# Patient Record
Sex: Female | Born: 1980 | Race: Asian | Hispanic: No | Marital: Married | Smoking: Never smoker
Health system: Southern US, Community
[De-identification: ages and names within clinical notes are randomized; demographics above are authoritative.]

---

## 2002-02-24 ENCOUNTER — Encounter: Admission: RE | Admit: 2002-02-24 | Discharge: 2002-02-24 | Payer: Self-pay | Admitting: Urology

## 2002-02-24 ENCOUNTER — Encounter: Payer: Self-pay | Admitting: Urology

## 2004-06-20 ENCOUNTER — Other Ambulatory Visit: Admission: RE | Admit: 2004-06-20 | Discharge: 2004-06-20 | Payer: Self-pay | Admitting: Obstetrics and Gynecology

## 2004-07-18 ENCOUNTER — Ambulatory Visit (HOSPITAL_COMMUNITY): Admission: RE | Admit: 2004-07-18 | Discharge: 2004-07-18 | Payer: Self-pay | Admitting: Obstetrics and Gynecology

## 2004-10-18 ENCOUNTER — Inpatient Hospital Stay (HOSPITAL_COMMUNITY): Admission: AD | Admit: 2004-10-18 | Discharge: 2004-10-24 | Payer: Self-pay | Admitting: *Deleted

## 2004-10-20 ENCOUNTER — Ambulatory Visit: Payer: Self-pay | Admitting: *Deleted

## 2004-10-22 ENCOUNTER — Encounter (INDEPENDENT_AMBULATORY_CARE_PROVIDER_SITE_OTHER): Payer: Self-pay | Admitting: *Deleted

## 2005-08-13 ENCOUNTER — Other Ambulatory Visit: Admission: RE | Admit: 2005-08-13 | Discharge: 2005-08-13 | Payer: Self-pay | Admitting: Obstetrics and Gynecology

## 2006-07-25 ENCOUNTER — Encounter: Admission: RE | Admit: 2006-07-25 | Discharge: 2006-07-25 | Payer: Self-pay | Admitting: Internal Medicine

## 2006-08-05 ENCOUNTER — Ambulatory Visit (HOSPITAL_COMMUNITY): Admission: RE | Admit: 2006-08-05 | Discharge: 2006-08-05 | Payer: Self-pay | Admitting: Internal Medicine

## 2007-05-06 ENCOUNTER — Emergency Department (HOSPITAL_COMMUNITY): Admission: EM | Admit: 2007-05-06 | Discharge: 2007-05-06 | Payer: Self-pay | Admitting: Emergency Medicine

## 2009-04-24 ENCOUNTER — Encounter: Payer: Self-pay | Admitting: Obstetrics and Gynecology

## 2009-04-24 ENCOUNTER — Inpatient Hospital Stay (HOSPITAL_COMMUNITY): Admission: AD | Admit: 2009-04-24 | Discharge: 2009-05-17 | Payer: Self-pay | Admitting: Obstetrics and Gynecology

## 2009-05-01 ENCOUNTER — Encounter: Payer: Self-pay | Admitting: Obstetrics and Gynecology

## 2009-05-04 ENCOUNTER — Encounter: Payer: Self-pay | Admitting: Obstetrics and Gynecology

## 2009-05-08 ENCOUNTER — Encounter: Payer: Self-pay | Admitting: Obstetrics and Gynecology

## 2009-05-12 ENCOUNTER — Encounter: Payer: Self-pay | Admitting: Obstetrics and Gynecology

## 2009-05-15 ENCOUNTER — Encounter: Payer: Self-pay | Admitting: Obstetrics and Gynecology

## 2009-06-29 ENCOUNTER — Inpatient Hospital Stay (HOSPITAL_COMMUNITY): Admission: AD | Admit: 2009-06-29 | Discharge: 2009-07-03 | Payer: Self-pay | Admitting: Obstetrics and Gynecology

## 2009-07-16 ENCOUNTER — Inpatient Hospital Stay (HOSPITAL_COMMUNITY): Admission: AD | Admit: 2009-07-16 | Discharge: 2009-07-19 | Payer: Self-pay | Admitting: Obstetrics and Gynecology

## 2009-07-17 ENCOUNTER — Encounter (INDEPENDENT_AMBULATORY_CARE_PROVIDER_SITE_OTHER): Payer: Self-pay | Admitting: Obstetrics and Gynecology

## 2010-04-15 ENCOUNTER — Encounter: Payer: Self-pay | Admitting: Obstetrics and Gynecology

## 2010-06-10 LAB — CBC
HCT: 36.8 % (ref 36.0–46.0)
Hemoglobin: 11.9 g/dL — ABNORMAL LOW (ref 12.0–15.0)
MCHC: 32.4 g/dL (ref 30.0–36.0)
RBC: 4.54 MIL/uL (ref 3.87–5.11)
RDW: 14.3 % (ref 11.5–15.5)

## 2010-06-10 LAB — COMPREHENSIVE METABOLIC PANEL
ALT: 14 U/L (ref 0–35)
Alkaline Phosphatase: 52 U/L (ref 39–117)
BUN: 6 mg/dL (ref 6–23)
CO2: 24 mEq/L (ref 19–32)
Calcium: 9.1 mg/dL (ref 8.4–10.5)
GFR calc non Af Amer: >60 mL/min (ref >60)
Glucose, Bld: 71 mg/dL (ref 70–99)
Potassium: 4.1 mEq/L (ref 3.5–5.1)
Sodium: 137 mEq/L (ref 135–145)
Total Protein: 6.8 g/dL (ref 6.0–8.3)

## 2010-06-10 LAB — URINE CULTURE: Culture: NO GROWTH

## 2010-06-10 LAB — RPR: RPR Ser Ql: NONREACTIVE

## 2010-06-10 LAB — LACTATE DEHYDROGENASE: LDH: 196 U/L (ref 94–250)

## 2010-06-12 LAB — COMPREHENSIVE METABOLIC PANEL
ALT: 13 U/L (ref 0–35)
ALT: 14 U/L (ref 0–35)
AST: 19 U/L (ref 0–37)
AST: 23 U/L (ref 0–37)
Albumin: 2.6 g/dL — ABNORMAL LOW (ref 3.5–5.2)
Alkaline Phosphatase: 93 U/L (ref 39–117)
CO2: 27 mEq/L (ref 19–32)
Calcium: 6.8 mg/dL — ABNORMAL LOW (ref 8.4–10.5)
Calcium: 9 mg/dL (ref 8.4–10.5)
Creatinine, Ser: 0.47 mg/dL (ref 0.4–1.2)
GFR calc Af Amer: >60 mL/min (ref >60)
GFR calc Af Amer: >60 mL/min (ref >60)
GFR calc non Af Amer: >60 mL/min (ref >60)
GFR calc non Af Amer: >60 mL/min (ref >60)
Potassium: 4.4 mEq/L (ref 3.5–5.1)
Sodium: 131 mEq/L — ABNORMAL LOW (ref 135–145)
Sodium: 134 mEq/L — ABNORMAL LOW (ref 135–145)
Total Protein: 6.3 g/dL (ref 6.0–8.3)

## 2010-06-12 LAB — CBC
MCHC: 32.7 g/dL (ref 30.0–36.0)
MCHC: 33.1 g/dL (ref 30.0–36.0)
MCV: 80.7 fL (ref 78.0–100.0)
Platelets: 199 10*3/uL (ref 150–400)
RBC: 3.41 MIL/uL — ABNORMAL LOW (ref 3.87–5.11)
RDW: 13.9 % (ref 11.5–15.5)
WBC: 12.5 10*3/uL — ABNORMAL HIGH (ref 4.0–10.5)

## 2010-06-12 LAB — URIC ACID: Uric Acid, Serum: 4.6 mg/dL (ref 2.4–7.0)

## 2010-06-12 LAB — MAGNESIUM: Magnesium: 5 mg/dL — ABNORMAL HIGH (ref 1.5–2.5)

## 2010-06-12 LAB — MRSA PCR SCREENING: MRSA by PCR: NEGATIVE

## 2010-06-13 LAB — CBC
HCT: 35.9 % — ABNORMAL LOW (ref 36.0–46.0)
HCT: 33.5 % — ABNORMAL LOW (ref 36.0–46.0)
HCT: 35.2 % — ABNORMAL LOW (ref 36.0–46.0)
HCT: 36 % (ref 36.0–46.0)
HCT: 36.8 % (ref 36.0–46.0)
Hemoglobin: 11.9 g/dL — ABNORMAL LOW (ref 12.0–15.0)
Hemoglobin: 11 g/dL — ABNORMAL LOW (ref 12.0–15.0)
Hemoglobin: 11.2 g/dL — ABNORMAL LOW (ref 12.0–15.0)
Hemoglobin: 11.5 g/dL — ABNORMAL LOW (ref 12.0–15.0)
MCHC: 32.6 g/dL (ref 30.0–36.0)
MCHC: 32.7 g/dL (ref 30.0–36.0)
MCHC: 32.8 g/dL (ref 30.0–36.0)
MCHC: 32.9 g/dL (ref 30.0–36.0)
MCV: 81 fL (ref 78.0–100.0)
MCV: 81.2 fL (ref 78.0–100.0)
MCV: 81.3 fL (ref 78.0–100.0)
MCV: 82.1 fL (ref 78.0–100.0)
MCV: 82.2 fL (ref 78.0–100.0)
Platelets: 173 10*3/uL (ref 150–400)
Platelets: 191 10*3/uL (ref 150–400)
Platelets: 175 10*3/uL (ref 150–400)
Platelets: 182 10*3/uL (ref 150–400)
Platelets: 196 10*3/uL (ref 150–400)
Platelets: 213 10*3/uL (ref 150–400)
RBC: 4.43 MIL/uL (ref 3.87–5.11)
RBC: 4.43 MIL/uL (ref 3.87–5.11)
RBC: 4.11 MIL/uL (ref 3.87–5.11)
RBC: 4.18 MIL/uL (ref 3.87–5.11)
RBC: 4.27 MIL/uL (ref 3.87–5.11)
RBC: 4.32 MIL/uL (ref 3.87–5.11)
RDW: 14.2 % (ref 11.5–15.5)
RDW: 14.3 % (ref 11.5–15.5)
RDW: 14.4 % (ref 11.5–15.5)
WBC: 11.2 10*3/uL — ABNORMAL HIGH (ref 4.0–10.5)
WBC: 11.3 10*3/uL — ABNORMAL HIGH (ref 4.0–10.5)
WBC: 11.6 10*3/uL — ABNORMAL HIGH (ref 4.0–10.5)
WBC: 13.2 10*3/uL — ABNORMAL HIGH (ref 4.0–10.5)
WBC: 13.9 10*3/uL — ABNORMAL HIGH (ref 4.0–10.5)

## 2010-06-13 LAB — CREATININE CLEARANCE, URINE, 24 HOUR
Collection Interval-CRCL: 24 hours
Collection Interval-CRCL: 24 hours
Collection Interval-CRCL: 24 hours
Creatinine Clearance: 101 mL/min (ref 75–115)
Creatinine Clearance: 144 mL/min — ABNORMAL HIGH (ref 75–115)
Creatinine, 24H Ur: 740 mg/d (ref 700–1800)
Creatinine, 24H Ur: 990 mg/d (ref 700–1800)
Creatinine, Urine: 16 mg/dL
Creatinine, Urine: 16 mg/dL
Creatinine, Urine: 25.4 mg/dL
Creatinine: 0.51 mg/dL (ref 0.4–1.2)
Urine Total Volume-CRCL: 6775 mL

## 2010-06-13 LAB — COMPREHENSIVE METABOLIC PANEL
ALT: 14 U/L (ref 0–35)
ALT: 17 U/L (ref 0–35)
ALT: 19 U/L (ref 0–35)
ALT: 20 U/L (ref 0–35)
AST: 16 U/L (ref 0–37)
AST: 14 U/L (ref 0–37)
AST: 21 U/L (ref 0–37)
Albumin: 2.4 g/dL — ABNORMAL LOW (ref 3.5–5.2)
Albumin: 2.3 g/dL — ABNORMAL LOW (ref 3.5–5.2)
Albumin: 2.5 g/dL — ABNORMAL LOW (ref 3.5–5.2)
Albumin: 2.6 g/dL — ABNORMAL LOW (ref 3.5–5.2)
Alkaline Phosphatase: 105 U/L (ref 39–117)
Alkaline Phosphatase: 48 U/L (ref 39–117)
Alkaline Phosphatase: 60 U/L (ref 39–117)
Alkaline Phosphatase: 62 U/L (ref 39–117)
Alkaline Phosphatase: 63 U/L (ref 39–117)
BUN: 4 mg/dL — ABNORMAL LOW (ref 6–23)
BUN: 5 mg/dL — ABNORMAL LOW (ref 6–23)
BUN: 7 mg/dL (ref 6–23)
BUN: 7 mg/dL (ref 6–23)
BUN: 8 mg/dL (ref 6–23)
CO2: 24 mEq/L (ref 19–32)
CO2: 26 mEq/L (ref 19–32)
CO2: 24 mEq/L (ref 19–32)
CO2: 25 mEq/L (ref 19–32)
CO2: 26 mEq/L (ref 19–32)
CO2: 27 mEq/L (ref 19–32)
Calcium: 8.3 mg/dL — ABNORMAL LOW (ref 8.4–10.5)
Calcium: 8.5 mg/dL (ref 8.4–10.5)
Calcium: 8.6 mg/dL (ref 8.4–10.5)
Calcium: 8.9 mg/dL (ref 8.4–10.5)
Chloride: 103 mEq/L (ref 96–112)
Chloride: 103 mEq/L (ref 96–112)
Chloride: 104 mEq/L (ref 96–112)
Chloride: 100 mEq/L (ref 96–112)
Chloride: 103 mEq/L (ref 96–112)
Chloride: 104 mEq/L (ref 96–112)
Creatinine, Ser: 0.48 mg/dL (ref 0.4–1.2)
Creatinine, Ser: 0.51 mg/dL (ref 0.4–1.2)
Creatinine, Ser: 0.47 mg/dL (ref 0.4–1.2)
Creatinine, Ser: 0.48 mg/dL (ref 0.4–1.2)
GFR calc Af Amer: >60 mL/min (ref >60)
GFR calc Af Amer: >60 mL/min (ref >60)
GFR calc Af Amer: >60 mL/min (ref >60)
GFR calc Af Amer: >60 mL/min (ref >60)
GFR calc non Af Amer: >60 mL/min (ref >60)
GFR calc non Af Amer: >60 mL/min (ref >60)
GFR calc non Af Amer: >60 mL/min (ref >60)
GFR calc non Af Amer: >60 mL/min (ref >60)
GFR calc non Af Amer: >60 mL/min (ref >60)
GFR calc non Af Amer: >60 mL/min (ref >60)
Glucose, Bld: 79 mg/dL (ref 70–99)
Glucose, Bld: 84 mg/dL (ref 70–99)
Glucose, Bld: 86 mg/dL (ref 70–99)
Glucose, Bld: 92 mg/dL (ref 70–99)
Potassium: 3.5 mEq/L (ref 3.5–5.1)
Potassium: 3.5 mEq/L (ref 3.5–5.1)
Potassium: 3.5 mEq/L (ref 3.5–5.1)
Potassium: 3.6 mEq/L (ref 3.5–5.1)
Potassium: 3.6 mEq/L (ref 3.5–5.1)
Potassium: 4.2 mEq/L (ref 3.5–5.1)
Sodium: 136 mEq/L (ref 135–145)
Sodium: 134 mEq/L — ABNORMAL LOW (ref 135–145)
Sodium: 134 mEq/L — ABNORMAL LOW (ref 135–145)
Total Bilirubin: 0.3 mg/dL (ref 0.3–1.2)
Total Bilirubin: 0.4 mg/dL (ref 0.3–1.2)
Total Bilirubin: 0.1 mg/dL — ABNORMAL LOW (ref 0.3–1.2)
Total Bilirubin: 0.2 mg/dL — ABNORMAL LOW (ref 0.3–1.2)
Total Bilirubin: 0.3 mg/dL (ref 0.3–1.2)
Total Protein: 6.3 g/dL (ref 6.0–8.3)
Total Protein: 5.8 g/dL — ABNORMAL LOW (ref 6.0–8.3)
Total Protein: 5.9 g/dL — ABNORMAL LOW (ref 6.0–8.3)

## 2010-06-13 LAB — CROSSMATCH: ABO/RH(D): B POS

## 2010-06-13 LAB — ANA: Anti Nuclear Antibody(ANA): NEGATIVE

## 2010-06-13 LAB — COMPLEMENT, TOTAL: Compl, Total (CH50): >60 U/mL — ABNORMAL HIGH (ref 31–60)

## 2010-06-13 LAB — LACTATE DEHYDROGENASE
LDH: 105 U/L (ref 94–250)
LDH: 108 U/L (ref 94–250)
LDH: 118 U/L (ref 94–250)
LDH: 205 U/L (ref 94–250)

## 2010-06-13 LAB — PROTEIN, URINE, 24 HOUR
Collection Interval-UPROT: 24 hours
Protein, 24H Urine: 407 mg/d — ABNORMAL HIGH (ref 50–100)
Protein, 24H Urine: 496 mg/d — ABNORMAL HIGH (ref 50–100)
Protein, Urine: 6 mg/dL
Urine Total Volume-UPROT: 4625 mL

## 2010-06-13 LAB — LUPUS ANTICOAGULANT PANEL: PTT Lupus Anticoagulant: 34.5 secs (ref 32.0–43.4)

## 2010-06-13 LAB — GLUCOSE, 1 HOUR GESTATIONAL: Glucose Tolerance, 1 hour: 176 mg/dL (ref 70–189)

## 2010-06-13 LAB — URIC ACID
Uric Acid, Serum: 3.7 mg/dL (ref 2.4–7.0)
Uric Acid, Serum: 3.7 mg/dL (ref 2.4–7.0)
Uric Acid, Serum: 3.8 mg/dL (ref 2.4–7.0)
Uric Acid, Serum: 4.3 mg/dL (ref 2.4–7.0)

## 2010-06-13 LAB — GLUCOSE, FASTING GESTATIONAL
Glucose Tolerance, Fasting: 84 mg/dL
Glucose Tolerance, Fasting: 87 mg/dL

## 2010-06-13 LAB — GLUCOSE, 3 HOUR GESTATIONAL: Glucose, GTT - 3 Hour: 126 mg/dL (ref 70–144)

## 2010-06-13 LAB — RPR: RPR Ser Ql: NONREACTIVE

## 2010-06-13 LAB — ABO/RH: ABO/RH(D): B POS

## 2010-08-10 NOTE — Discharge Summary (Signed)
NAMEDrema Fuller                      ACCOUNT NO.:  [id number]   MEDICAL RECORD NO.:  [id number]          PATIENT TYPE:  INP   LOCATION:  9312                          FACILITY:  [facility]   PHYSICIAN:  Naima A. Dillard, M.D. DATE OF BIRTH:  12-Oct-1980   DATE OF ADMISSION:  10/18/2004  DATE OF DISCHARGE:                                 DISCHARGE SUMMARY   ADMISSION DIAGNOSES:  1.  Intrauterine pregnancy at 31-5/7 weeks.  2.  Labile blood pressure.  3.  Rule out preeclampsia, 24-hour urine showing 1538 mg of protein in 24      hours for a diagnosis of preeclampsia.   PROCEDURES:  1.  Normal spontaneous vaginal delivery.  2.  Repair of first-degree right vaginal labial laceration.   DISCHARGE DIAGNOSES:  1.  Intrauterine pregnancy at 32 weeks, delivered.  2.  Preeclampsia, resolved.  3.  Normal spontaneous vaginal delivery and repair of first-degree right      vaginal labial laceration.   Ms. Erika Fuller is a 30 year old gravida 1, para 0, who presented to [facility] at 31-5/7 weeks from the office for evaluation of high blood  pressure . Her protein on a 24-hour urine specimen was 1538 mg of protein in  24 hours.  With diagnosis of preeclampsia, a discussion was entered into  with the patient regarding the benefit of delivery.  This was explained to  the patient with the use of an interpreter, and decision was made to begin  induction of labor on October 22, 2004.  The patient had normal spontaneous  vaginal delivery and repair of first-degree right vaginal labial laceration  with Dr. Stefano Gaul in attendance for the birth of a 3 pound 9-1/2 ounce  female infant named Toniann Fail, with Apgar scores of 4 at one minute, 7 at five  minutes.  A tight nuchal cord was noted and cord pH 7.30.  The patient has  done well in the postpartum period.  Her blood pressures for the first 24  hours on magnesium sulfate were 110-125/65-80.  Her O2 saturations remained,  and her blood work was within normal  limits.  Her hemoglobin on the first  postpartum day was 9.2.  Her magnesium sulfate was discontinued and for  greater than 24 hours now, she has been stable.  Her blood pressures have  been 122-137/75-88.  She has no headache, visual changes or epigastric pain  and on this her second postpartum day, she is judged to be in satisfactory  condition for discharge, her preeclampsia resolving.   Her discharge her instructions are per [facility] handout.  Discharge medications include Motrin over-the-counter for pain. The patient  will call for headache, visual changes, epigastric pain or increased  swelling or any pain, problems or concerns.  She will follow up in the  office of [facility] in one week for a blood pressure check on October 29, 2004 a  10:00 a.m. with Dr. Osie Bond, C.N.M.      Naima A. Normand Sloop, M.D.  Electronically Signed  SDM/MEDQ  D:  10/24/2004  T:  10/24/2004  Job:  [id number]
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
## 2010-12-19 IMAGING — US US FETAL BPP W/O NONSTRESS
1 series · 14 of 15 positions shown · non-contrast
Comparison: none

OBSTETRICAL ULTRASOUND:
 This ultrasound was performed in The [HOSPITAL], and the [facility] report will be stored to [REDACTED] PACS.  This report is also available in [HOSPITAL]?s accessANYware.

[Series 1: us fetal bpp w/o nonstress · 0.30mm/px · 15 acquisitions, 14 frames shown]
[im 1/15]
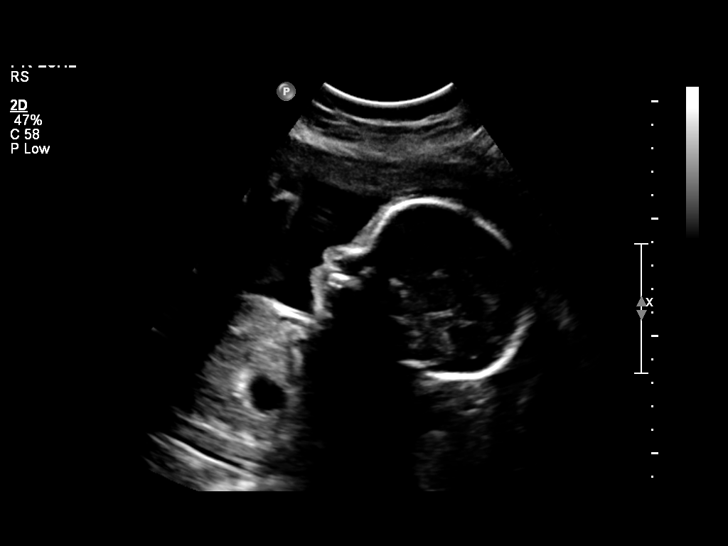
[im 2/15]
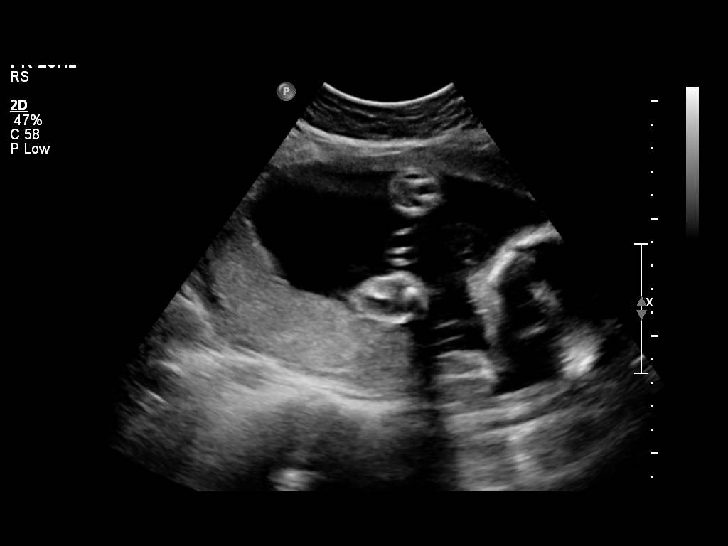
[im 3/15]
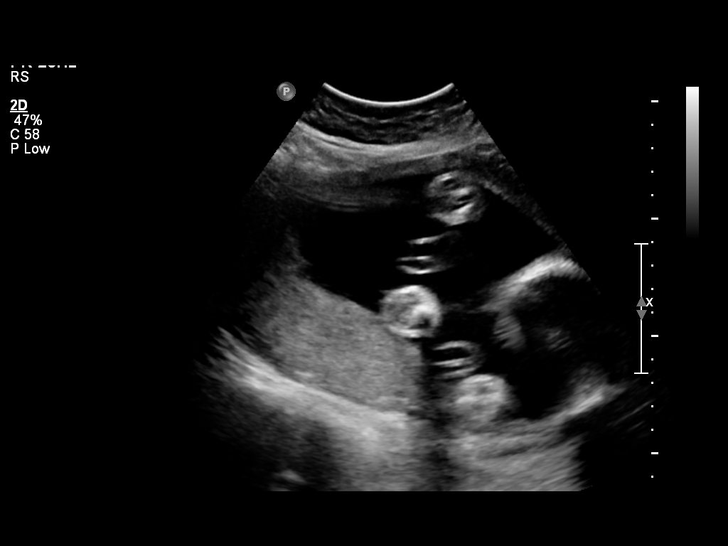
[im 4/15]
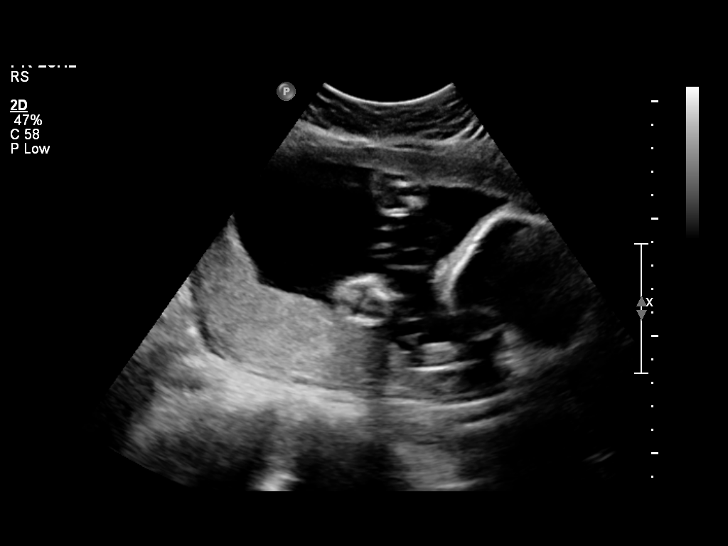
[im 5/15]
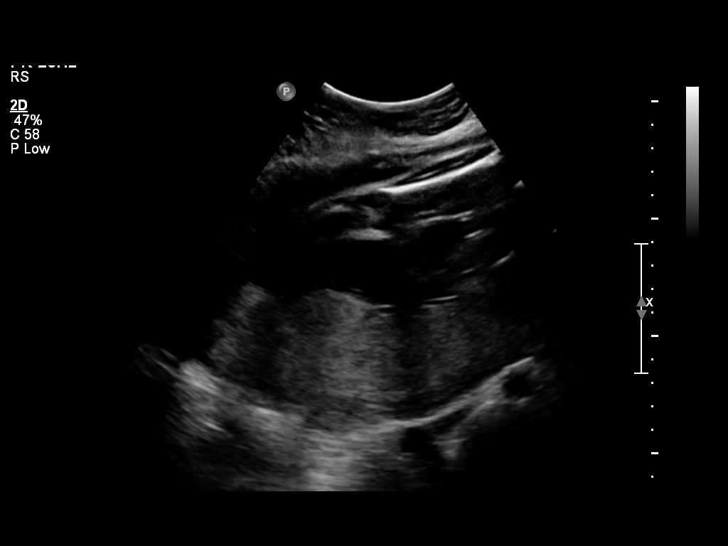
[im 6/15]
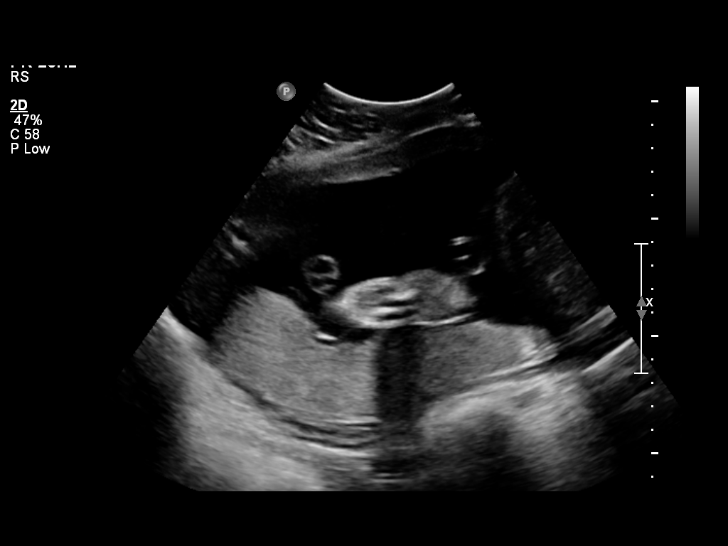
[im 7/15]
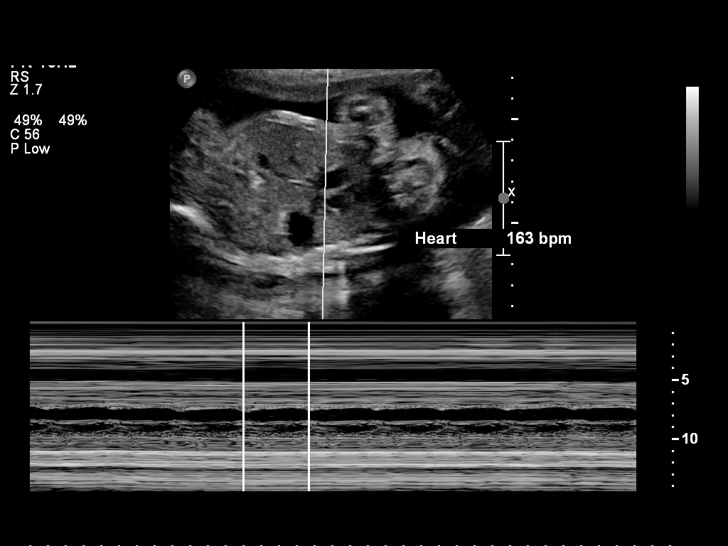
[im 9/15]
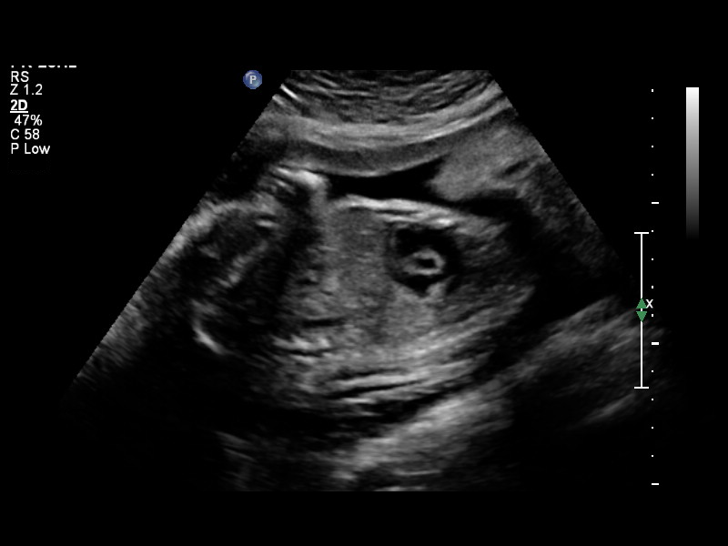
[im 10/15]
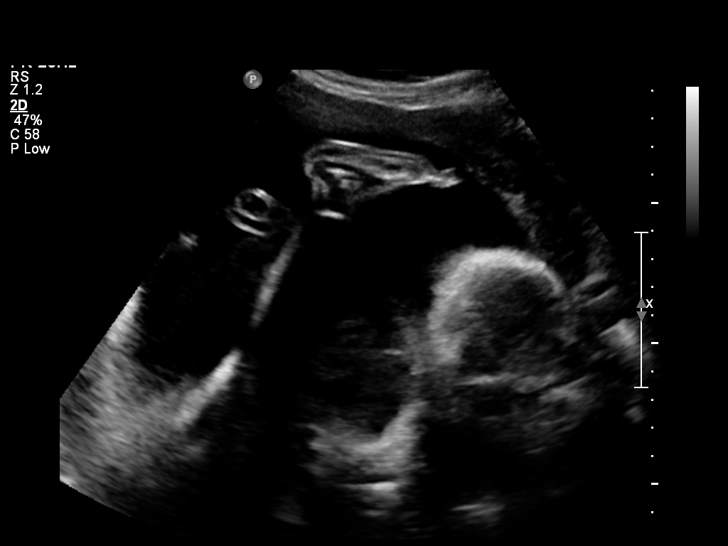
[im 11/15]
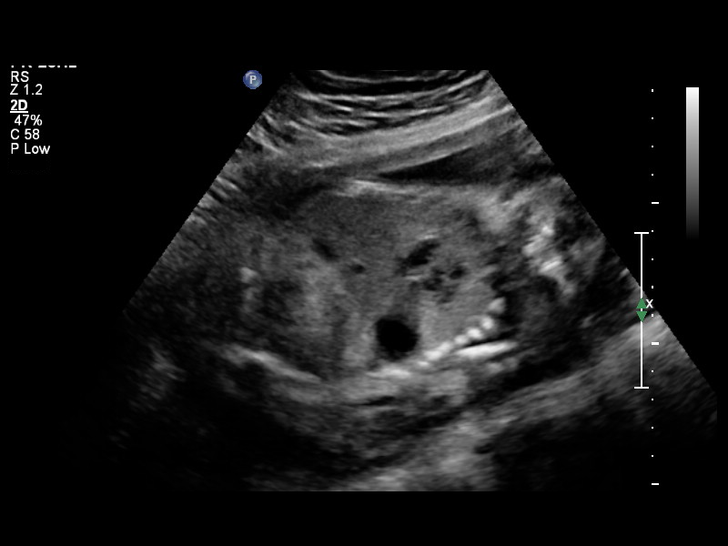
[im 12/15]
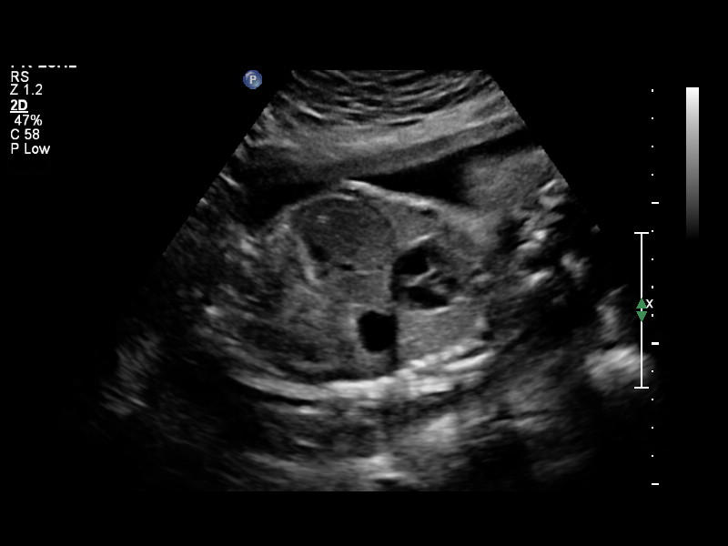
[im 13/15]
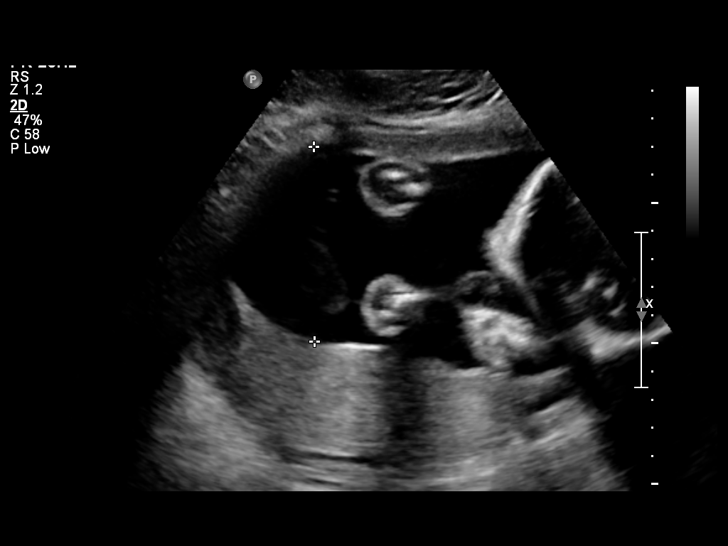
[im 14/15]
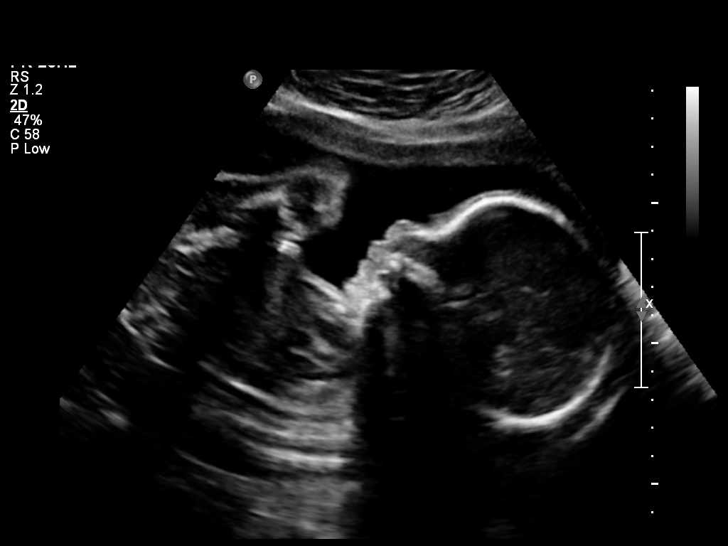
[im 15/15]
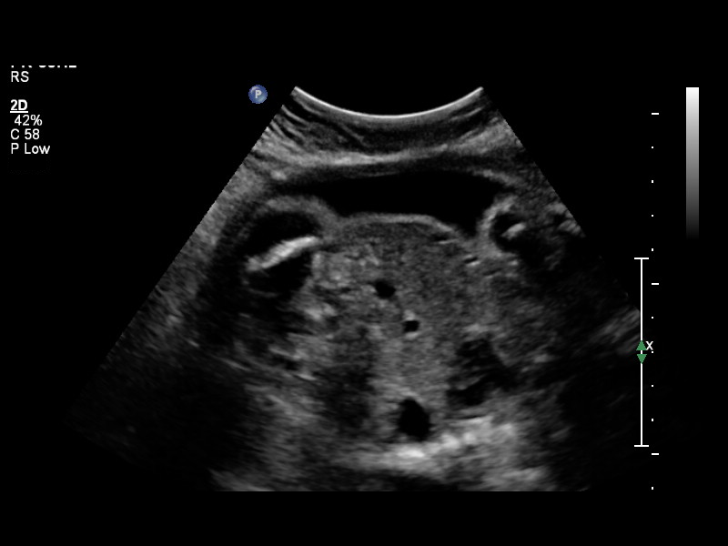

[14 of 15 positions shown; findings below may reference images not displayed]

IMPRESSION: [facility] has also been faxed to the ordering physician.

## 2011-02-14 IMAGING — US US OB FOLLOW-UP
1 series · 14 of 28 positions shown · non-contrast
Comparison: none

OBSTETRICAL ULTRASOUND:
 This ultrasound exam was performed in the [HOSPITAL] Ultrasound Department.  The OB US report was generated in the AS system, and faxed to the ordering physician.  This report is also available in [HOSPITAL]?s AccessANYware and in [REDACTED] PACS.

[Series 1: us ob follow up · 0.27mm/px · 30 acquisitions, 14 frames shown]
[im 2/30]
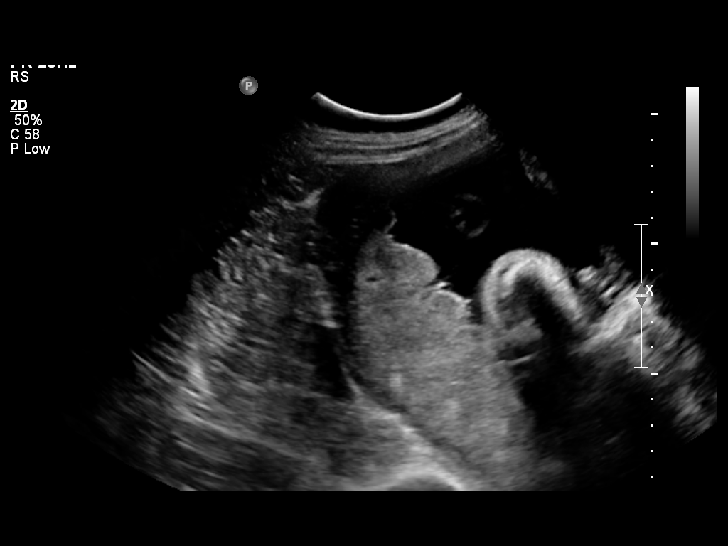
[im 4/30]
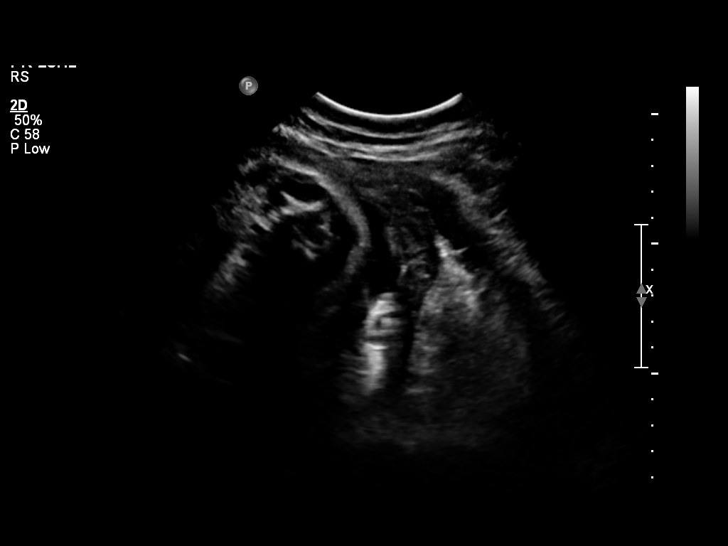
[im 6/30]
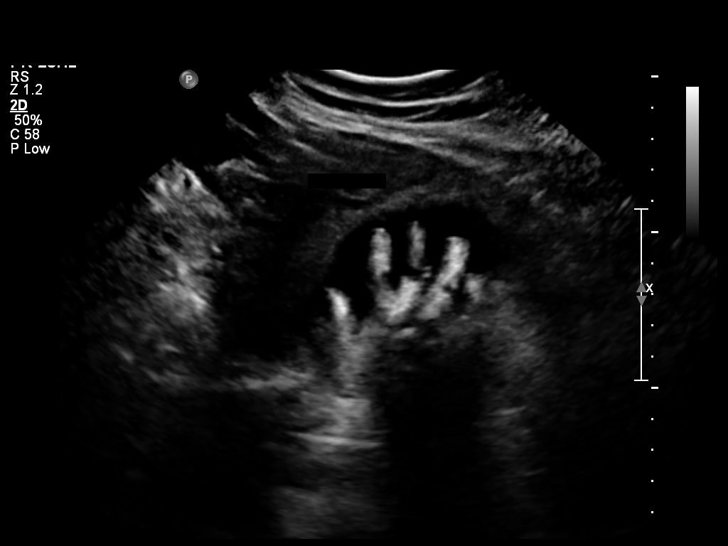
[im 8/30]
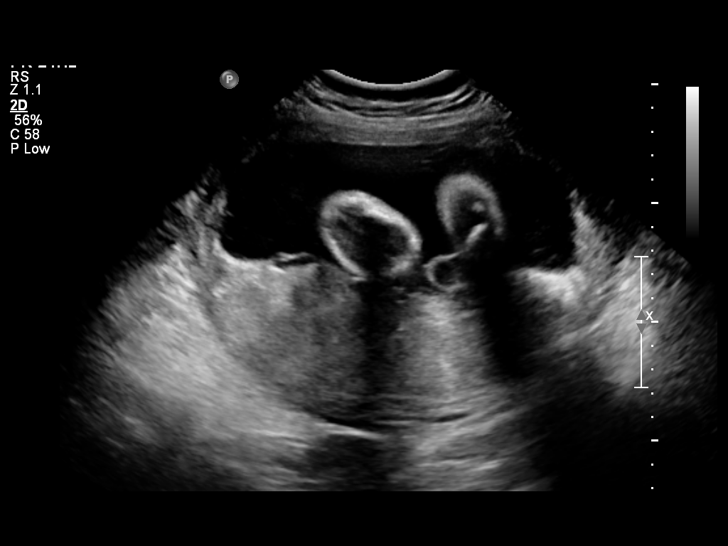
[im 10/30]
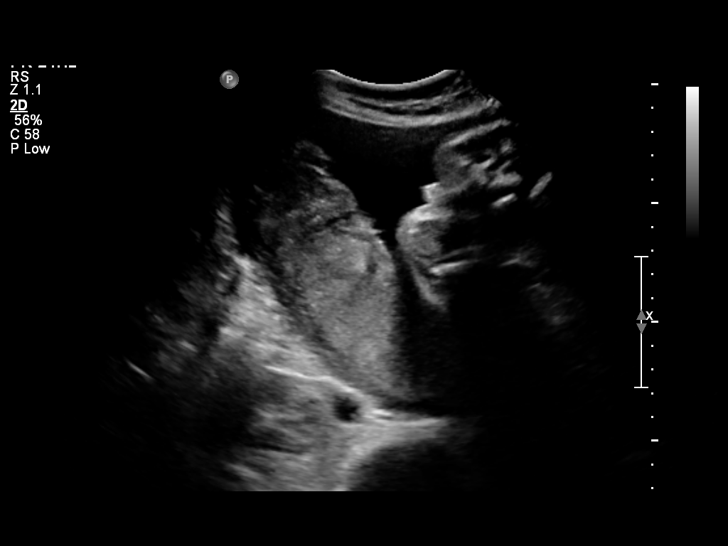
[im 12/30]
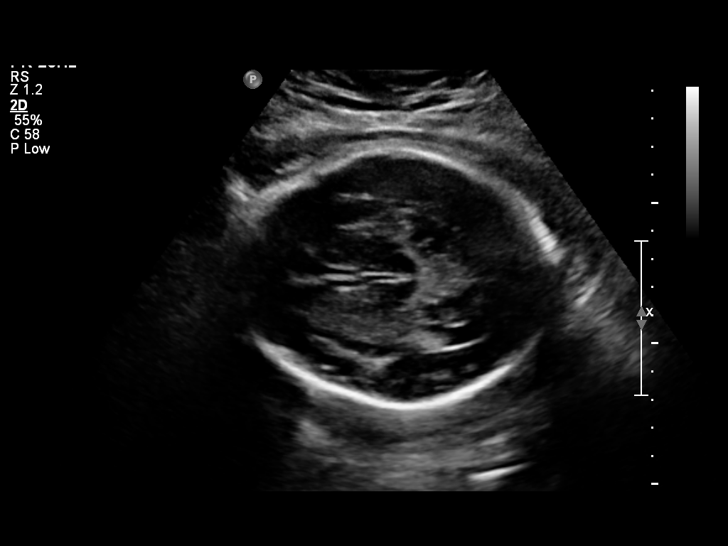
[im 14/30]
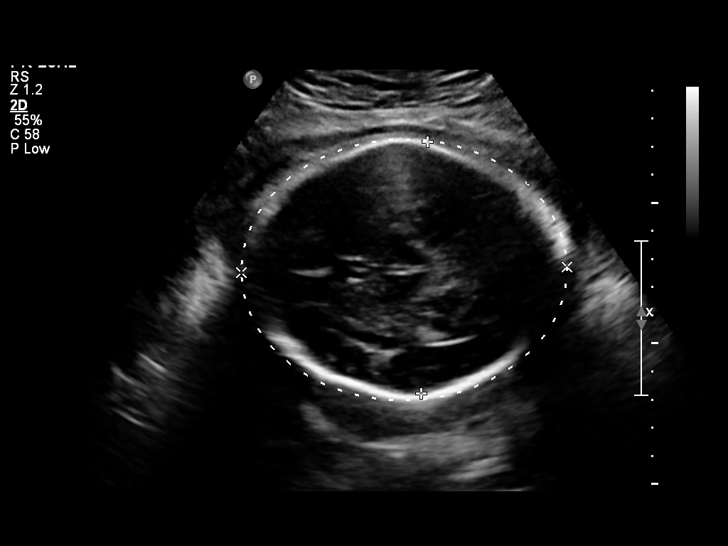
[im 17/30]
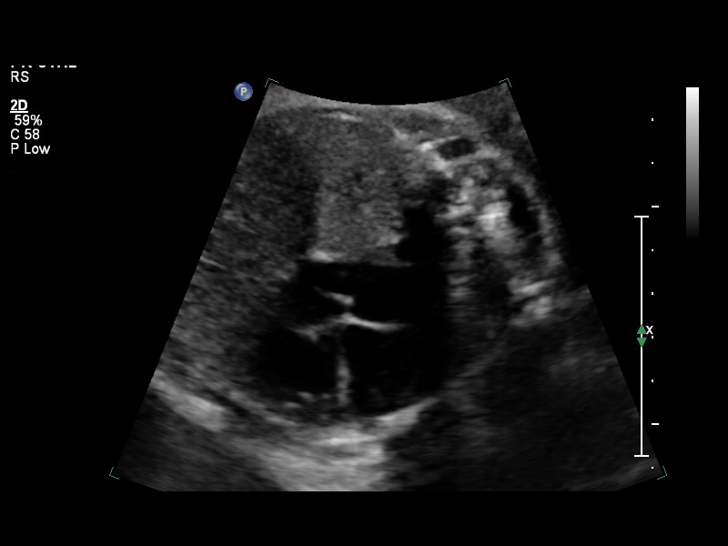
[im 19/30]
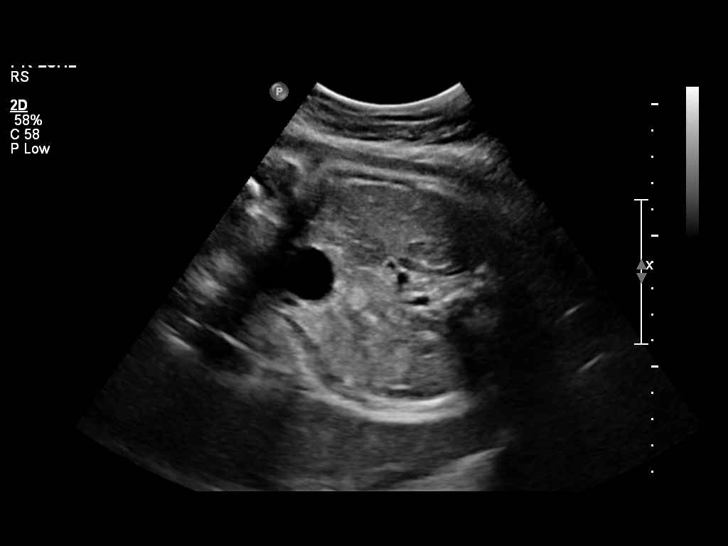
[im 21/30]
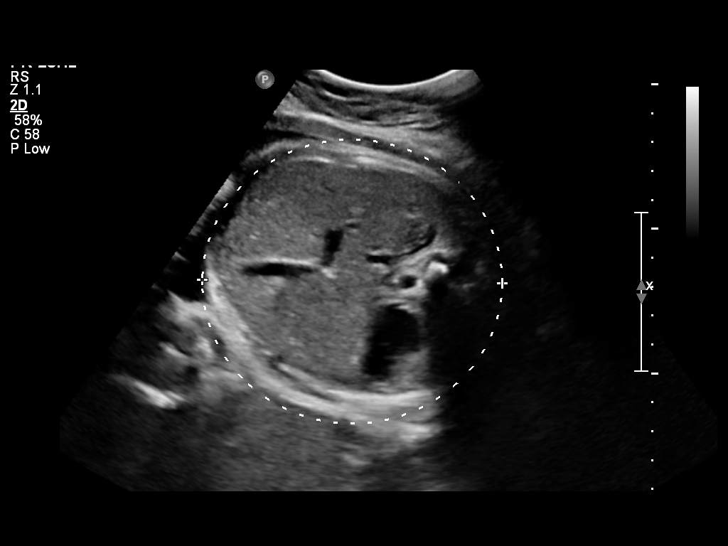
[im 23/30]
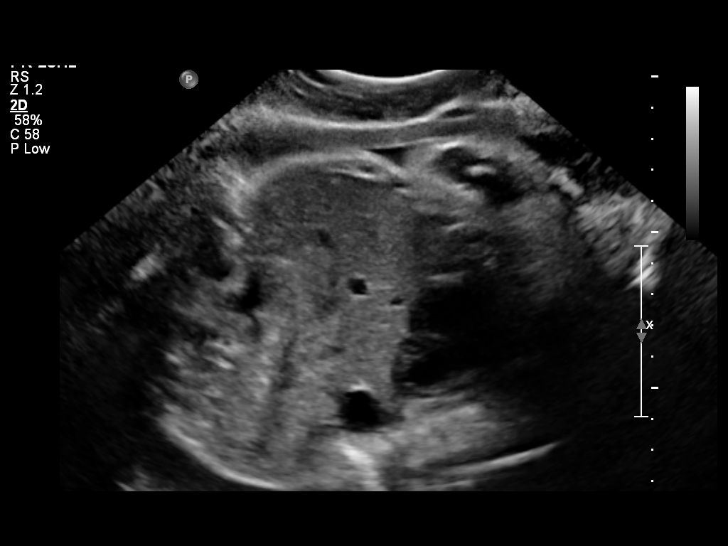
[im 25/30]
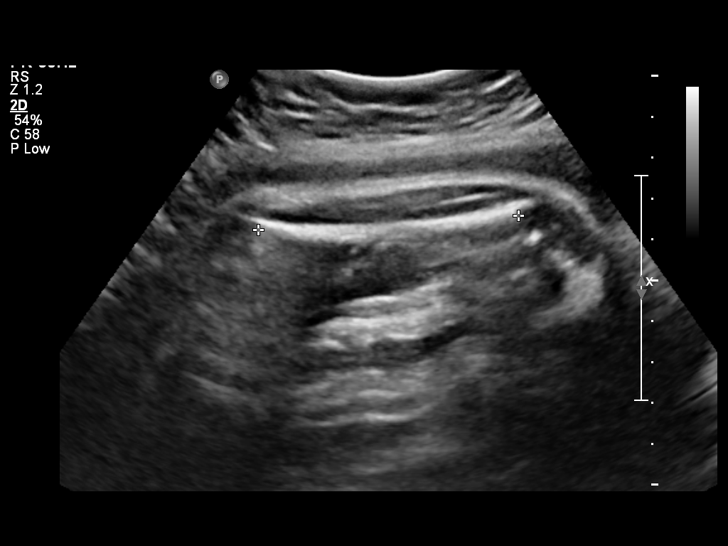
[im 27/30]
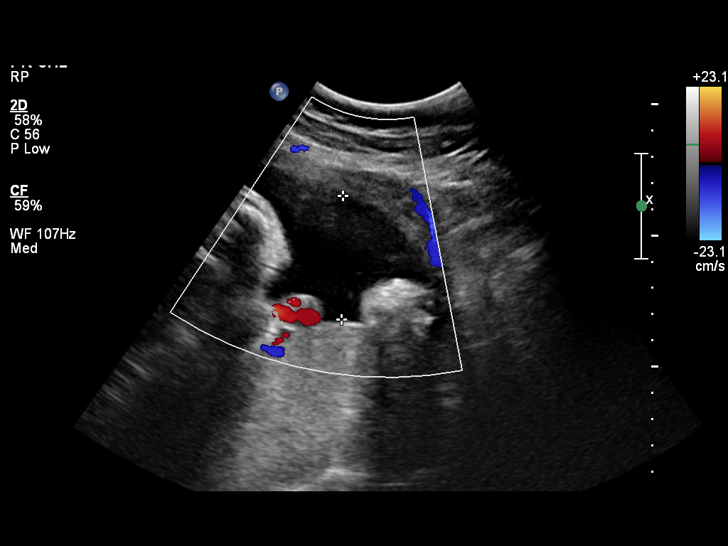
[im 30/30]
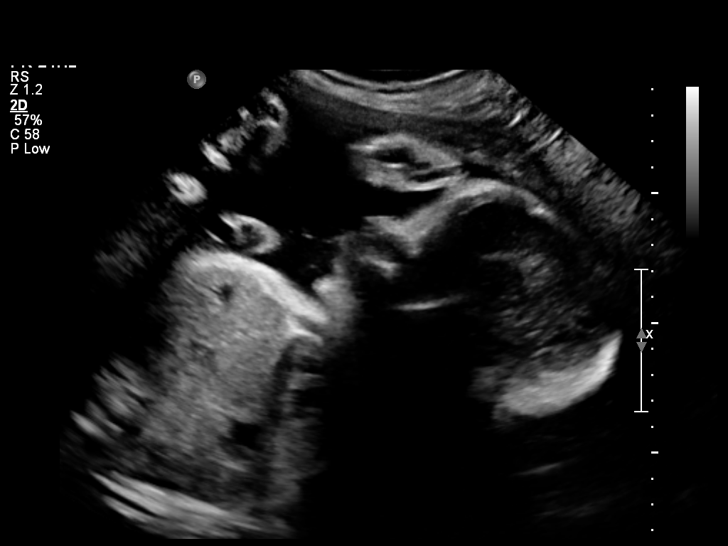

[14 of 28 positions shown; findings below may reference images not displayed]

IMPRESSION: See [facility] Obstetric US report.

## 2016-06-06 ENCOUNTER — Ambulatory Visit (HOSPITAL_COMMUNITY)
Admission: EM | Admit: 2016-06-06 | Discharge: 2016-06-06 | Disposition: A | Discharge status: Home / Self Care | Payer: Self-pay

## 2016-06-06 ENCOUNTER — Encounter (HOSPITAL_COMMUNITY): Payer: Self-pay | Admitting: Emergency Medicine

## 2016-06-06 DIAGNOSIS — M7918 Myalgia, other site: Secondary | ICD-10-CM

## 2016-06-06 MED ORDER — MELOXICAM 15 MG PO TABS: 7.5000 mg | ORAL_TABLET | Freq: Every day | ORAL | 0 refills | Status: AC

## 2016-06-06 MED ORDER — METHOCARBAMOL 500 MG PO TABS: 500.0000 mg | ORAL_TABLET | Freq: Four times a day (QID) | ORAL | 0 refills | Status: AC | PRN

## 2016-06-06 NOTE — Discharge Instructions (Signed)
There is no evidence of permenant injury. You have muscle strain.  This will resolve with time. Please stay active. Use a heating pad, and the meloxicam as needed for pain relief. Use the robaxin for muscle tension.
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
## 2017-07-31 ENCOUNTER — Emergency Department (HOSPITAL_COMMUNITY)
Admission: EM | Admit: 2017-07-31 | Discharge: 2017-08-01 | Disposition: A | Discharge status: Home / Self Care | Payer: Self-pay | Attending: Emergency Medicine | Admitting: Emergency Medicine

## 2017-07-31 ENCOUNTER — Other Ambulatory Visit: Payer: Self-pay

## 2017-07-31 ENCOUNTER — Encounter (HOSPITAL_COMMUNITY): Payer: Self-pay

## 2017-07-31 DIAGNOSIS — K30 Functional dyspepsia: Secondary | ICD-10-CM | POA: Insufficient documentation

## 2017-07-31 DIAGNOSIS — Z79899 Other long term (current) drug therapy: Secondary | ICD-10-CM | POA: Insufficient documentation

## 2017-07-31 DIAGNOSIS — R1013 Epigastric pain: Secondary | ICD-10-CM

## 2017-07-31 NOTE — ED Triage Notes (Signed)
Pt reports RUQ pain for the past three weeks, unrelieved by antiacids, c/o of nausea.

## 2017-08-01 ENCOUNTER — Other Ambulatory Visit: Payer: Self-pay

## 2017-08-01 ENCOUNTER — Emergency Department (HOSPITAL_COMMUNITY): Payer: Self-pay

## 2017-08-01 LAB — CBC
HCT: 40.2 % (ref 36.0–46.0)
HEMOGLOBIN: 13.6 g/dL (ref 12.0–15.0)
MCH: 26.3 pg (ref 26.0–34.0)
MCHC: 33.8 g/dL (ref 30.0–36.0)
MCV: 77.6 fL — ABNORMAL LOW (ref 78.0–100.0)
PLATELETS: 220 10*3/uL (ref 150–400)
RBC: 5.18 MIL/uL — ABNORMAL HIGH (ref 3.87–5.11)
RDW: 13.5 % (ref 11.5–15.5)
WBC: 6.7 10*3/uL (ref 4.0–10.5)

## 2017-08-01 LAB — COMPREHENSIVE METABOLIC PANEL
ALT: 29 U/L (ref 14–54)
AST: 22 U/L (ref 15–41)
Albumin: 3.6 g/dL (ref 3.5–5.0)
Alkaline Phosphatase: 47 U/L (ref 38–126)
Anion gap: 7 (ref 5–15)
BUN: 10 mg/dL (ref 6–20)
CALCIUM: 9.2 mg/dL (ref 8.9–10.3)
CHLORIDE: 103 mmol/L (ref 101–111)
CO2: 28 mmol/L (ref 22–32)
CREATININE: 0.93 mg/dL (ref 0.44–1.00)
GLUCOSE: 100 mg/dL — AB (ref 65–99)
POTASSIUM: 3.3 mmol/L — AB (ref 3.5–5.1)
Sodium: 138 mmol/L (ref 135–145)
TOTAL PROTEIN: 7.2 g/dL (ref 6.5–8.1)
Total Bilirubin: 0.3 mg/dL (ref 0.3–1.2)

## 2017-08-01 LAB — URINALYSIS, ROUTINE W REFLEX MICROSCOPIC
BACTERIA UA: NONE SEEN
Bilirubin Urine: NEGATIVE
Glucose, UA: NEGATIVE mg/dL
Ketones, ur: NEGATIVE mg/dL
NITRITE: NEGATIVE
PROTEIN: 100 mg/dL — AB
Specific Gravity, Urine: 1.005 (ref 1.005–1.030)
pH: 8 (ref 5.0–8.0)

## 2017-08-01 LAB — I-STAT BETA HCG BLOOD, ED (MC, WL, AP ONLY)

## 2017-08-01 LAB — LIPASE, BLOOD: LIPASE: 41 U/L (ref 11–51)

## 2017-08-01 MED ORDER — GI COCKTAIL ~~LOC~~
30.0000 mL | Freq: Once | ORAL | Status: AC
  Administered 2017-08-01: 30 mL via ORAL
  Filled 2017-08-01: qty 30

## 2017-08-01 MED ORDER — SUCRALFATE 1 G PO TABS: 1.0000 g | ORAL_TABLET | Freq: Three times a day (TID) | ORAL | 0 refills | Status: AC

## 2017-08-01 MED ORDER — OMEPRAZOLE 20 MG PO CPDR
DELAYED_RELEASE_CAPSULE | ORAL | 0 refills | Status: AC
Start: 2017-08-01 — End: ?

## 2017-08-01 NOTE — Discharge Instructions (Addendum)
Follow up with your doctor in one week for recheck of your symptoms of abdominal pain and heartburn.

## 2017-08-01 NOTE — ED Provider Notes (Signed)
[facility] EMERGENCY DEPARTMENT Provider Note   CSN: [id number] Arrival date & time: 07/31/17  2236     History   Chief Complaint Chief Complaint  Patient presents with  . Abdominal Pain    HPI Erika Fuller is a 37 y.o. female.  Patient here with complaint of "heartburn". She has symptoms of upper abdominal discomfort for the past 3 weeks, increased belching that sometimes produces an acid substance. She denies vomiting. No SOB, cough or difficulty breathing. She states that when she wakes in the mornings there is a sour taste in her mouth. It is not really affected by foods and she reports symptoms are worst when sitting up, better when lying down. She denies history of heartburn in the past.   The history is provided by the patient. No language interpreter was used.    History reviewed. No pertinent past medical history.  There are no active problems to display for this patient.   History reviewed. No pertinent surgical history.   OB History   None      Home Medications    Prior to Admission medications   Medication Sig Start Date End Date Taking? Authorizing Provider  acetaminophen (TYLENOL) 325 MG tablet Take 650 mg by mouth every 6 (six) hours as needed.    [provider]  meloxicam (MOBIC) 15 MG tablet Take 0.5-1 tablets (7.5-15 mg total) by mouth daily. 06/06/16   Ozella Rocks, MD  methocarbamol (ROBAXIN) 500 MG tablet Take 1-2 tablets (500-1,000 mg total) by mouth every 6 (six) hours as needed for muscle spasms. 06/06/16   Ozella Rocks, MD    Family History No family history on file.  Social History Social History   Tobacco Use  . Smoking status: Never Smoker  . Smokeless tobacco: Never Used  Substance Use Topics  . Alcohol use: No  . Drug use: No     Allergies   Patient has no known allergies.   Review of Systems Review of Systems  Constitutional: Negative for chills and fever.  HENT: Negative.     Respiratory: Negative.   Cardiovascular: Negative.   Gastrointestinal: Positive for abdominal pain. Negative for nausea and vomiting.       See HPI.  Musculoskeletal: Negative.   Skin: Negative.   Neurological: Negative.      Physical Exam Updated Vital Signs BP 136/81 (BP Location: Right Arm)   Pulse 80   Temp 97.6 F (36.4 C) (Oral)   Resp 19   SpO2 100%   Physical Exam  Constitutional: She appears well-developed and well-nourished.  HENT:  Head: Normocephalic.  Neck: Normal range of motion. Neck supple.  Cardiovascular: Normal rate and regular rhythm.  Pulmonary/Chest: Effort normal and breath sounds normal. She has no wheezes. She has no rhonchi. She has no rales.  Abdominal: Soft. Bowel sounds are normal. There is tenderness (Very mild tenderness. ) in the epigastric area. There is no rebound and no guarding.  Musculoskeletal: Normal range of motion.  Neurological: She is alert. No cranial nerve deficit.  Skin: Skin is warm and dry. No rash noted.  Psychiatric: She has a normal mood and affect.  Nursing note and vitals reviewed.    ED Treatments / Results  Labs (all labs ordered are listed, but only abnormal results are displayed) Labs Reviewed  COMPREHENSIVE METABOLIC PANEL - Abnormal; Notable for the following components:      Result Value   Potassium 3.3 (*)    Glucose, Bld 100 (*)  All other components within normal limits  CBC - Abnormal; Notable for the following components:   RBC 5.18 (*)    MCV 77.6 (*)    All other components within normal limits  URINALYSIS, ROUTINE W REFLEX MICROSCOPIC - Abnormal; Notable for the following components:   Color, Urine STRAW (*)    Hgb urine dipstick SMALL (*)    Protein, ur 100 (*)    Leukocytes, UA TRACE (*)    All other components within normal limits  LIPASE, BLOOD  I-STAT BETA HCG BLOOD, ED ([facility], [facility], [facility] ONLY)    EKG None  Radiology US Abdomen Limited  Result Date: 08/01/2017 CLINICAL DATA:   37 year old female with right upper quadrant abdominal pain. EXAM: ULTRASOUND ABDOMEN LIMITED RIGHT UPPER QUADRANT COMPARISON:  Abdominal CT dated 02/24/2002 FINDINGS: Gallbladder: No gallstones or wall thickening visualized. No sonographic Murphy sign noted by sonographer. Common bile duct: Diameter: 2 mm Liver: The liver is unremarkable. Portal vein is patent on color Doppler imaging with normal direction of blood flow towards the liver. IMPRESSION: Unremarkable right upper quadrant ultrasound. Electronically Signed   By: Elgie Collard M.D.   On: 08/01/2017 01:29    Procedures Procedures (including critical care time)  Medications Ordered in ED Medications  gi cocktail (Maalox,Lidocaine,Donnatal) (has no administration in time range)     Initial Impression / Assessment and Plan / ED Course  I have reviewed the triage vital signs and the nursing notes.  Pertinent labs & imaging results that were available during my care of the patient were reviewed by me and considered in my medical decision making (see chart for details).     Patient presents with complaint of "heartburn" describing symptoms of belching, epigastric pain radiates to chest as burning, acid taste in her mouth. She has tried Maalox without relief. No CP, SOB.   GI cocktail provided. Will reassess.  GI cocktail with some relief but not complete resolution. Labs are normal. US performed and is negative. VSS.   She can be discharged home with Prilosec and follow up with PCP in one week for recheck.   Final Clinical Impressions(s) / ED Diagnoses   Final diagnoses:  None   1. Dyspepsia   ED Discharge Orders    None       Danne Harbor 08/01/17 2229    Azalia Bilis, MD 08/01/17 [phone]

## 2017-08-01 NOTE — ED Notes (Signed)
Labs and imaging reviewed 

## 2021-09-04 ENCOUNTER — Other Ambulatory Visit (HOSPITAL_BASED_OUTPATIENT_CLINIC_OR_DEPARTMENT_OTHER): Payer: Self-pay

## 2021-09-04 ENCOUNTER — Other Ambulatory Visit: Payer: Self-pay

## 2021-09-04 DIAGNOSIS — R809 Proteinuria, unspecified: Secondary | ICD-10-CM

## 2021-09-06 ENCOUNTER — Ambulatory Visit (HOSPITAL_BASED_OUTPATIENT_CLINIC_OR_DEPARTMENT_OTHER)
Admission: RE | Admit: 2021-09-06 | Discharge: 2021-09-06 | Disposition: A | Discharge status: Home / Self Care | Payer: 59 | Source: Ambulatory Visit

## 2021-09-06 DIAGNOSIS — R809 Proteinuria, unspecified: Secondary | ICD-10-CM

## 2022-03-07 ENCOUNTER — Other Ambulatory Visit (HOSPITAL_COMMUNITY): Payer: Self-pay | Admitting: Nephrology

## 2022-03-07 DIAGNOSIS — R801 Persistent proteinuria, unspecified: Secondary | ICD-10-CM

## 2022-04-09 ENCOUNTER — Other Ambulatory Visit: Payer: Self-pay | Admitting: Radiology

## 2022-04-09 DIAGNOSIS — R809 Proteinuria, unspecified: Secondary | ICD-10-CM

## 2022-04-09 NOTE — H&P (Signed)
Chief Complaint: Patient was seen in consultation today for non-focal renal biopsy to evaluate proteinuria  Referring Physician(s): Singh,Vikas  Supervising Physician: Ruthann Cancer  Patient Status: Erika Fuller - Out-pt  History of Present Illness: Erika Fuller is a 42 y.o. female with a medical history significant for preeclampsia x 2 recently referred to [facility] for evaluation of persistent proteinuria. Lab work has been unrevealing and medical management with ARB and SGLT2 inhibitor has helped only slightly. A renal ultrasound was unremarkable.   Interventional Radiology has been asked to evaluate this patient for an image-guided non-focal renal biopsy for further work up.   No past medical history on file.  No past surgical history on file.  Allergies: Patient has no known allergies.  Medications: Prior to Admission medications   Medication Sig Start Date End Date Taking? Authorizing Provider  acetaminophen (TYLENOL) 325 MG tablet Take 650 mg by mouth every 6 (six) hours as needed.    [provider]  meloxicam (MOBIC) 15 MG tablet Take 0.5-1 tablets (7.5-15 mg total) by mouth daily. 06/06/16   Waldemar Dickens, MD  methocarbamol (ROBAXIN) 500 MG tablet Take 1-2 tablets (500-1,000 mg total) by mouth every 6 (six) hours as needed for muscle spasms. 06/06/16   Waldemar Dickens, MD  omeprazole (PRILOSEC) 20 MG capsule Take twice daily for 5 days, then once daily after that 08/01/17   Charlann Lange, PA-C  sucralfate (CARAFATE) 1 g tablet Take 1 tablet (1 g total) by mouth 4 (four) times daily -  with meals and at bedtime. 08/01/17   Charlann Lange, PA-C     No family history on file.  Social History   Socioeconomic History   Marital status: Married    Spouse name: Not on file   Number of children: Not on file   Years of education: Not on file   Highest education level: Not on file  Occupational History   Not on file  Tobacco Use   Smoking status: Never    Smokeless tobacco: Never  Substance and Sexual Activity   Alcohol use: No   Drug use: No   Sexual activity: Not on file  Other Topics Concern   Not on file  Social History Narrative   Not on file   Social Determinants of Health   Financial Resource Strain: Not on file  Food Insecurity: Not on file  Transportation Needs: Not on file  Physical Activity: Not on file  Stress: Not on file  Social Connections: Not on file    Review of Systems: A 12 point ROS discussed and pertinent positives are indicated in the HPI above.  All other systems are negative.  Review of Systems  Constitutional:  Negative for appetite change and fatigue.  Respiratory:  Negative for cough and shortness of breath.   Cardiovascular:  Negative for leg swelling.  Gastrointestinal:  Negative for abdominal pain, diarrhea, nausea and vomiting.  Genitourinary:  Negative for flank pain and hematuria.  Musculoskeletal:  Negative for back pain.  Neurological:  Negative for dizziness and headaches.    Vital Signs: BP (!) 140/80   Pulse (!) 58   Temp (!) 97.4 F (36.3 C) (Temporal)   Resp 17   Ht 4\' 11"  (1.499 m)   Wt 136 lb (61.7 kg)   LMP 03/22/2022 (Exact Date)   SpO2 100%   BMI 27.47 kg/m   Physical Exam Constitutional:      General: She is not in acute distress.    Appearance: She  is not ill-appearing.  HENT:     Mouth/Throat:     Mouth: Mucous membranes are moist.     Pharynx: Oropharynx is clear.  Cardiovascular:     Rate and Rhythm: Normal rate and regular rhythm.     Pulses: Normal pulses.     Heart sounds: Normal heart sounds.  Pulmonary:     Effort: Pulmonary effort is normal.     Breath sounds: Normal breath sounds.  Abdominal:     General: Bowel sounds are normal.     Palpations: Abdomen is soft.     Tenderness: There is no abdominal tenderness.  Musculoskeletal:     Right lower leg: No edema.     Left lower leg: No edema.  Skin:    General: Skin is warm and dry.   Neurological:     Mental Status: She is alert and oriented to person, place, and time.     Imaging: No results found.  Labs:  CBC: Pending  COAGS: Pending  Assessment and Plan:  Persistent proteinuria: Erika Fuller, 42 year old female, presents today to the [facility] Interventional Radiology department for an image-guided non-focal renal biopsy to evaluate persistent proteinuria.   Risks and benefits of this procedure were discussed with the patient and/or patient's family including, but not limited to bleeding, infection, damage to adjacent structures or low yield requiring additional tests.  All of the questions were answered and there is agreement to proceed. She has been NPO. She does not take any blood-thinning medications. Labs are pending but will be reviewed prior to the start of the procedure.   Consent signed and in chart.  Thank you for this interesting consult.  I greatly enjoyed meeting Charli Ralston and look forward to participating in their care.  A copy of this report was sent to the requesting provider on this date.  Electronically Signed: Soyla Dryer, AGACNP-BC [phone] 04/10/2022, 7:33 AM   I spent a total of  30 Minutes   in face to face in clinical consultation, greater than 50% of which was counseling/coordinating care for non-focal renal biopsy

## 2022-04-10 ENCOUNTER — Ambulatory Visit (HOSPITAL_COMMUNITY)
Admission: RE | Admit: 2022-04-10 | Discharge: 2022-04-10 | Disposition: A | Discharge status: Home / Self Care | Payer: Commercial Managed Care - HMO | Source: Ambulatory Visit | Attending: Nephrology | Admitting: Nephrology

## 2022-04-10 DIAGNOSIS — R801 Persistent proteinuria, unspecified: Secondary | ICD-10-CM | POA: Insufficient documentation

## 2022-04-10 DIAGNOSIS — R809 Proteinuria, unspecified: Secondary | ICD-10-CM | POA: Diagnosis not present

## 2022-04-10 LAB — CBC
HCT: 42.4 % (ref 36.0–46.0)
Hemoglobin: 13.6 g/dL (ref 12.0–15.0)
MCH: 23.1 pg — ABNORMAL LOW (ref 26.0–34.0)
MCHC: 32.1 g/dL (ref 30.0–36.0)
MCV: 72 fL — ABNORMAL LOW (ref 80.0–100.0)
Platelets: 296 10*3/uL (ref 150–400)
RBC: 5.89 MIL/uL — ABNORMAL HIGH (ref 3.87–5.11)
RDW: 16.5 % — ABNORMAL HIGH (ref 11.5–15.5)
WBC: 8.8 10*3/uL (ref 4.0–10.5)
nRBC: 0 % (ref 0.0–0.2)

## 2022-04-10 LAB — PREGNANCY, URINE: Preg Test, Ur: NEGATIVE

## 2022-04-10 LAB — PROTIME-INR
INR: 0.9 (ref 0.8–1.2)
Prothrombin Time: 12.4 seconds (ref 11.4–15.2)

## 2022-04-10 MED ORDER — FENTANYL CITRATE (PF) 100 MCG/2ML IJ SOLN
INTRAMUSCULAR | Status: AC
  Filled 2022-04-10: qty 2

## 2022-04-10 MED ORDER — MIDAZOLAM HCL 2 MG/2ML IJ SOLN
INTRAMUSCULAR | Status: AC
  Filled 2022-04-10: qty 2

## 2022-04-10 MED ORDER — GELATIN ABSORBABLE 12-7 MM EX MISC
CUTANEOUS | Status: AC
  Filled 2022-04-10: qty 1

## 2022-04-10 MED ORDER — LIDOCAINE HCL (PF) 1 % IJ SOLN
INTRAMUSCULAR | Status: AC
  Filled 2022-04-10: qty 30

## 2022-04-10 MED ORDER — LIDOCAINE HCL (PF) 1 % IJ SOLN
10.0000 mL | Freq: Once | INTRAMUSCULAR | Status: AC
  Administered 2022-04-10: 10 mL via INTRADERMAL

## 2022-04-10 MED ORDER — MIDAZOLAM HCL 2 MG/2ML IJ SOLN
INTRAMUSCULAR | Status: AC | PRN
  Administered 2022-04-10 (×2): 1 mg via INTRAVENOUS

## 2022-04-10 MED ORDER — FENTANYL CITRATE (PF) 100 MCG/2ML IJ SOLN
INTRAMUSCULAR | Status: AC | PRN
  Administered 2022-04-10: 50 ug via INTRAVENOUS

## 2022-04-10 MED ORDER — SODIUM CHLORIDE 0.9 % IV SOLN: INTRAVENOUS | Status: DC

## 2022-04-10 MED ORDER — GELATIN ABSORBABLE 12-7 MM EX MISC
1.0000 | Freq: Once | CUTANEOUS | Status: AC
  Administered 2022-04-10: 1 via TOPICAL

## 2022-04-10 NOTE — Procedures (Signed)
Interventional Radiology Procedure Note  Procedure: Ultrasound guided kidney biopsy  Findings: Please refer to procedural dictation for full description. 16 ga core x2 from right inferior pole cortex.  [place] slurry needle track embolization.  Complications: None immediate  Estimated Blood Loss: < 5 mL  Recommendations: Strict 3 hour bedrest. Follow up Pathology results.   Ruthann Cancer, MD

## 2022-05-15 ENCOUNTER — Encounter (HOSPITAL_COMMUNITY): Payer: Self-pay

## 2022-05-21 LAB — SURGICAL PATHOLOGY

## 2022-06-05 ENCOUNTER — Other Ambulatory Visit: Payer: Self-pay

## 2022-06-05 DIAGNOSIS — Z1231 Encounter for screening mammogram for malignant neoplasm of breast: Secondary | ICD-10-CM

## 2022-07-19 ENCOUNTER — Ambulatory Visit
Admission: RE | Admit: 2022-07-19 | Discharge: 2022-07-19 | Disposition: A | Discharge status: Home / Self Care | Payer: Commercial Managed Care - HMO | Source: Ambulatory Visit

## 2022-07-19 DIAGNOSIS — Z1231 Encounter for screening mammogram for malignant neoplasm of breast: Secondary | ICD-10-CM
